# Patient Record
Sex: Male | Born: 1980 | Race: White | Hispanic: No | Marital: Single | Smoking: Never smoker
Health system: Southern US, Community
[De-identification: ages and names within clinical notes are randomized; demographics above are authoritative.]

---

## 2020-06-14 ENCOUNTER — Ambulatory Visit (INDEPENDENT_AMBULATORY_CARE_PROVIDER_SITE_OTHER): Payer: Managed Care, Other (non HMO)

## 2020-06-14 ENCOUNTER — Other Ambulatory Visit: Payer: Self-pay

## 2020-06-14 ENCOUNTER — Ambulatory Visit
Admission: EM | Admit: 2020-06-14 | Discharge: 2020-06-14 | Disposition: A | Discharge status: Home / Self Care | Payer: Managed Care, Other (non HMO) | Attending: Internal Medicine | Admitting: Internal Medicine

## 2020-06-14 DIAGNOSIS — R0602 Shortness of breath: Secondary | ICD-10-CM

## 2020-06-14 DIAGNOSIS — U071 COVID-19: Secondary | ICD-10-CM

## 2020-06-14 MED ORDER — BENZONATATE 100 MG PO CAPS: 100.0000 mg | ORAL_CAPSULE | Freq: Three times a day (TID) | ORAL | 0 refills | Status: AC

## 2020-06-14 MED ORDER — ONDANSETRON 4 MG PO TBDP: 4.0000 mg | ORAL_TABLET | Freq: Three times a day (TID) | ORAL | 0 refills | Status: AC | PRN

## 2020-06-14 MED ORDER — IBUPROFEN 600 MG PO TABS: 600.0000 mg | ORAL_TABLET | Freq: Four times a day (QID) | ORAL | 0 refills | Status: AC | PRN

## 2020-06-14 NOTE — ED Triage Notes (Signed)
Pt began feeling bad on Sunday, tested positive for covid on Monday, having increased sob

## 2020-06-15 LAB — BASIC METABOLIC PANEL
BUN/Creatinine Ratio: 11 (ref 9–20)
BUN: 12 mg/dL (ref 6–24)
CO2: 24 mmol/L (ref 20–29)
Calcium: 8.8 mg/dL (ref 8.7–10.2)
Chloride: 98 mmol/L (ref 96–106)
Creatinine, Ser: 1.11 mg/dL (ref 0.76–1.27)
GFR calc Af Amer: 95 mL/min/{1.73_m2} (ref >59)
GFR calc non Af Amer: 82 mL/min/{1.73_m2} (ref >59)
Glucose: 90 mg/dL (ref 65–99)
Potassium: 4.6 mmol/L (ref 3.5–5.2)
Sodium: 138 mmol/L (ref 134–144)

## 2020-06-15 NOTE — ED Provider Notes (Addendum)
[facility]    CSN: [id number] Arrival date & time: 06/14/20  1143      History   Chief Complaint No chief complaint on file.   HPI Luke Clay is a 39 y.o. male comes to urgent care with cough and increasing shortness of breath of 3 days duration.  Patient was feeling unwell and decided to test himself for Covid at home.  He tested positive for Covid.  He denies any chest tightness.  No fever or chills.  No wheezing.  No vomiting.  No dizziness.  Patient's cough is now productive of sputum.  No diarrhea.  Decreased oral intake with some nausea History reviewed. No pertinent past medical history.  There are no problems to display for this patient.   History reviewed. No pertinent surgical history.     Home Medications    Prior to Admission medications   Medication Sig Start Date End Date Taking? Authorizing Provider  benzonatate (TESSALON) 100 MG capsule Take 1 capsule (100 mg total) by mouth every 8 (eight) hours. 06/14/20   Merrilee Jansky, MD  ibuprofen (ADVIL) 600 MG tablet Take 1 tablet (600 mg total) by mouth every 6 (six) hours as needed. 06/14/20   Merrilee Jansky, MD  ondansetron (ZOFRAN ODT) 4 MG disintegrating tablet Take 1 tablet (4 mg total) by mouth every 8 (eight) hours as needed for nausea or vomiting. 06/14/20   Hadi Dubin, Britta Mccreedy, MD    Family History History reviewed. No pertinent family history.  Social History Social History   Tobacco Use  . Smoking status: Never Smoker  . Smokeless tobacco: Never Used  Substance Use Topics  . Alcohol use: Not Currently  . Drug use: Not Currently     Allergies   Patient has no known allergies.   Review of Systems Review of Systems  Constitutional: Positive for fatigue. Negative for chills and fever.  Respiratory: Positive for cough, chest tightness and shortness of breath. Negative for wheezing.   Cardiovascular: Negative for chest pain.  Gastrointestinal: Negative for diarrhea, nausea  and vomiting.  Genitourinary: Negative.   Neurological: Positive for headaches. Negative for dizziness and numbness.     Physical Exam Triage Vital Signs ED Triage Vitals  Enc Vitals Group     BP 06/14/20 1156 (!) 153/107     Pulse Rate 06/14/20 1156 (!) 103     Resp 06/14/20 1156 (!) 22     Temp 06/14/20 1156 99.1 F (37.3 C)     Temp src --      SpO2 06/14/20 1156 95 %     Weight 06/14/20 1154 238 lb (108 kg)     Height 06/14/20 1154 5\' 9"  (1.753 m)     Head Circumference --      Peak Flow --      Pain Score --      Pain Loc --      Pain Edu? --      Excl. in GC? --    No data found.  Updated Vital Signs BP (!) 153/107   Pulse (!) 103   Temp 99.1 F (37.3 C)   Resp (!) 22   Ht 5\' 9"  (1.753 m)   Wt 108 kg   SpO2 95% Comment: desats to 91  BMI 35.15 kg/m   Visual Acuity Right Eye Distance:   Left Eye Distance:   Bilateral Distance:    Right Eye Near:   Left Eye Near:    Bilateral Near:  Physical Exam Vitals and nursing note reviewed.  Constitutional:      General: He is not in acute distress.    Appearance: He is not ill-appearing.  HENT:     Right Ear: Tympanic membrane normal.     Left Ear: Tympanic membrane normal.  Cardiovascular:     Rate and Rhythm: Normal rate and regular rhythm.     Pulses: Normal pulses.     Heart sounds: Normal heart sounds.  Pulmonary:     Effort: Pulmonary effort is normal.     Breath sounds: Normal breath sounds. No wheezing, rhonchi or rales.  Abdominal:     General: Bowel sounds are normal. There is no distension.     Palpations: Abdomen is soft.     Tenderness: There is no abdominal tenderness.     Hernia: No hernia is present.  Musculoskeletal:     Cervical back: Normal range of motion. No rigidity or tenderness.  Neurological:     Mental Status: He is alert.      UC Treatments / Results  Labs (all labs ordered are listed, but only abnormal results are displayed) Labs Reviewed  BASIC METABOLIC PANEL    Narrative:    Performed at:  [address] [facility] [address], [place], [place]  [id number] Lab Director: Jolene Schimke MD, Phone:  [phone]    EKG   Radiology DG Chest 2 View  Result Date: 06/14/2020 CLINICAL DATA:  COVID-19 positivity with shortness of breath EXAM: CHEST - 2 VIEW COMPARISON:  None. FINDINGS: Cardiac shadow is within normal limits. The lungs are well aerated bilaterally. Bilateral nipple piercings are seen. No acute bony abnormality is seen. IMPRESSION: No acute abnormality noted. Electronically Signed   By: Alcide Clever M.D.   On: 06/14/2020 12:41    Procedures Procedures (including critical care time)  Medications Ordered in UC Medications - No data to display  Initial Impression / Assessment and Plan / UC Course  I have reviewed the triage vital signs and the nursing notes.  Pertinent labs & imaging results that were available during my care of the patient were reviewed by me and considered in my medical decision making (see chart for details).     1.  COVID-19 infection COVID-19 test was positive BMP Tessalon Perles as needed for cough Ibuprofen 600 mg every 6 hours as needed for pain Zofran as needed for nausea vomiting Increase oral fluid intake Please quarantine for 10 days from the onset of symptoms per CDC guidelines Return to urgent care if symptoms worsen. Chest x-ray is negative for acute lung infiltrate. Final Clinical Impressions(s) / UC Diagnoses   Final diagnoses:  COVID-19   Discharge Instructions   None    ED Prescriptions    Medication Sig Dispense Auth. Provider   ibuprofen (ADVIL) 600 MG tablet Take 1 tablet (600 mg total) by mouth every 6 (six) hours as needed. 30 tablet Kemper Hochman, Britta Mccreedy, MD   benzonatate (TESSALON) 100 MG capsule Take 1 capsule (100 mg total) by mouth every 8 (eight) hours. 21 capsule Adrain Butrick, Britta Mccreedy, MD   ondansetron (ZOFRAN ODT) 4 MG disintegrating tablet Take 1 tablet (4 mg total) by mouth  every 8 (eight) hours as needed for nausea or vomiting. 20 tablet Kaladin Noseworthy, Britta Mccreedy, MD     PDMP not reviewed this encounter.   Merrilee Jansky, MD 06/19/20 1132    Merrilee Jansky, MD 06/19/20 [phone]

## 2020-06-16 ENCOUNTER — Telehealth: Payer: Self-pay | Admitting: Physician Assistant

## 2020-06-16 NOTE — Telephone Encounter (Signed)
Called to Discuss with patient about Covid symptoms and the use of the monoclonal antibody infusion for those with mild to moderate Covid symptoms and at a high risk of hospitalization.     Pt appears to qualify for this infusion due to co-morbid conditions and/or a member of an at-risk group in accordance with the FDA Emergency Use Authorization.    Unable to reach pt - VM full, sent [facility] message.   Appears to qualify with BMI only. Sx 12/10, + 12/13. ER visit 12/16, currently at Urgent Care  today.

## 2021-05-15 IMAGING — DX DG CHEST 2V
2 series · 2 of 2 positions shown · non-contrast
Comparison: None.

CLINICAL DATA: WHN2D-RL positivity with shortness of breath

EXAM:
CHEST - 2 VIEW

[chest pa]
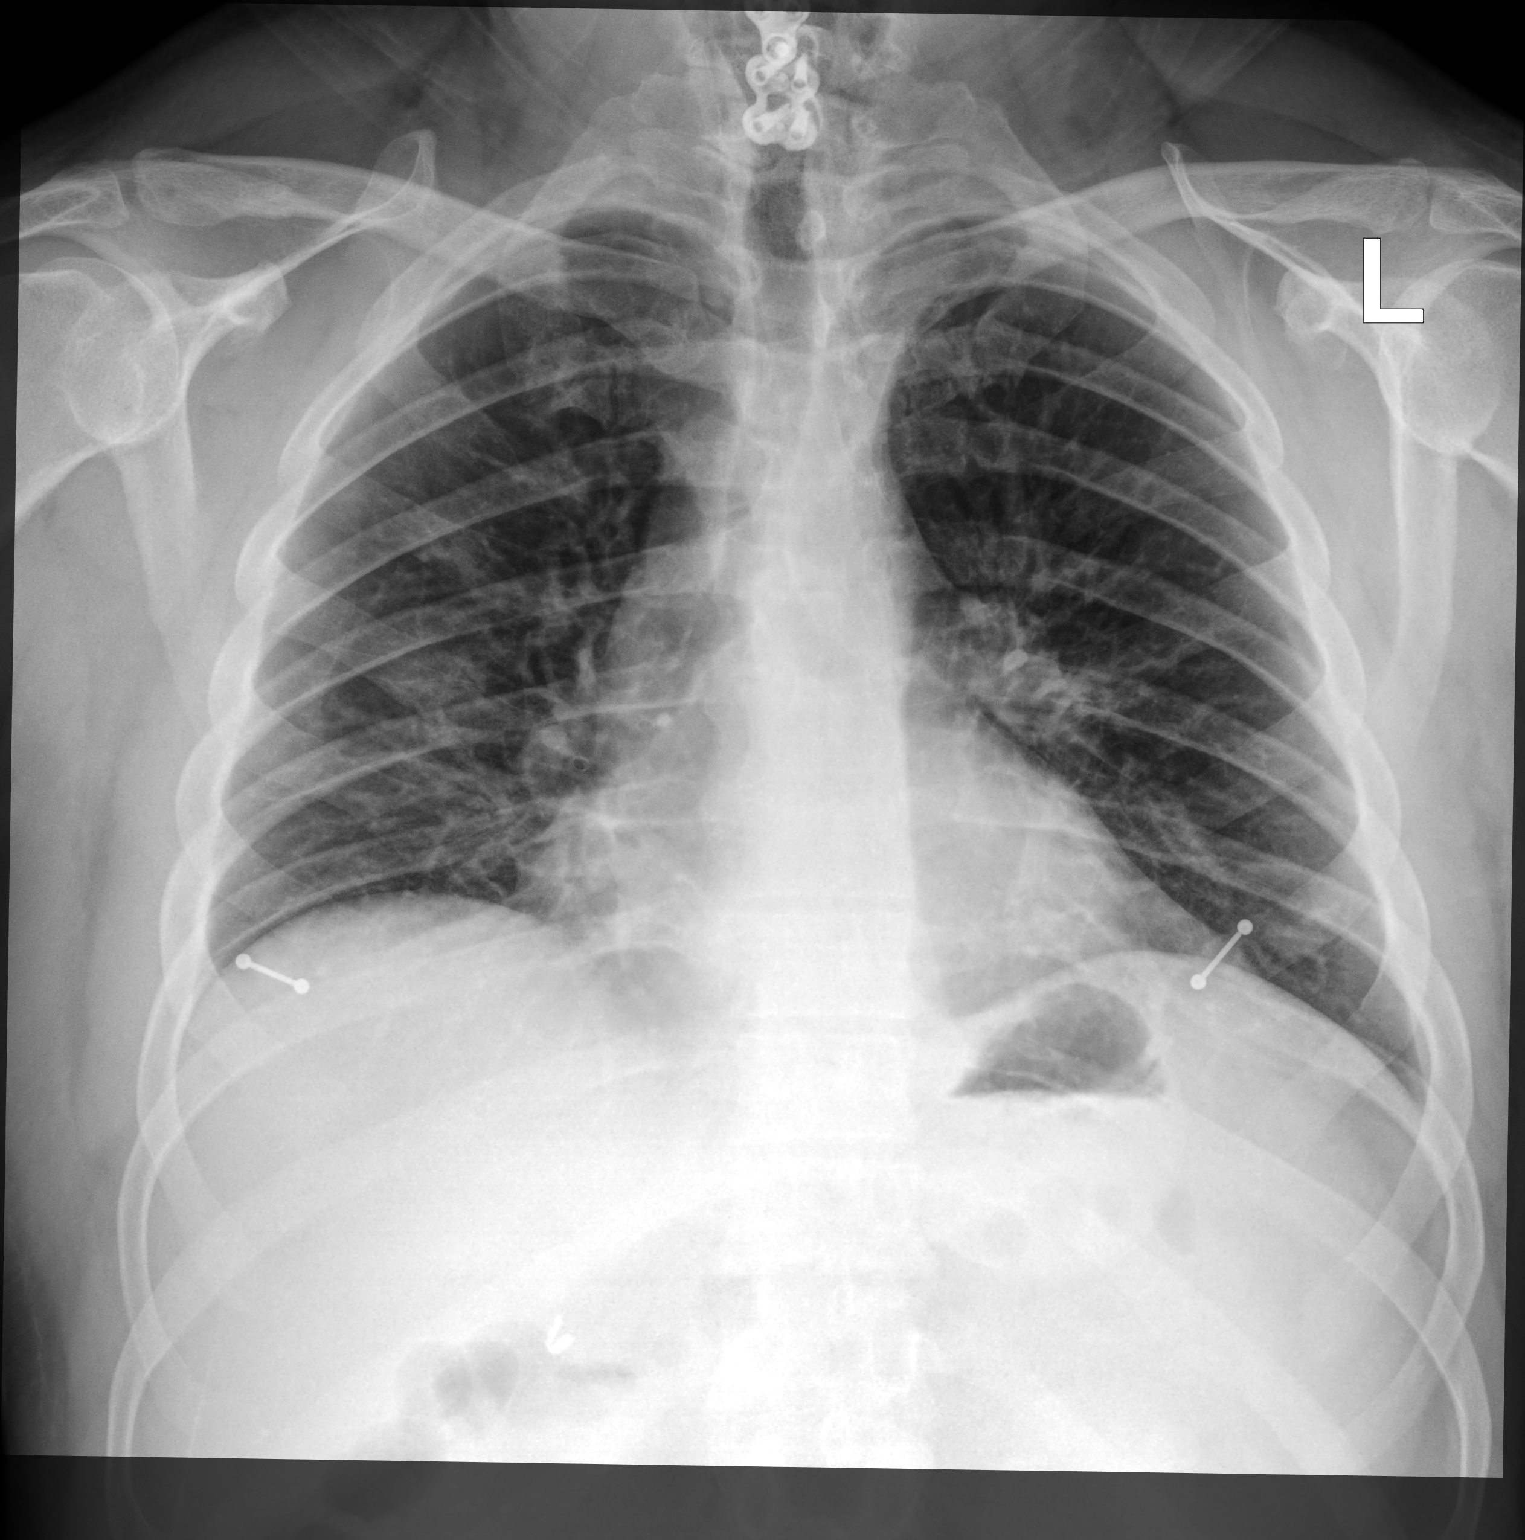

[chest lat]
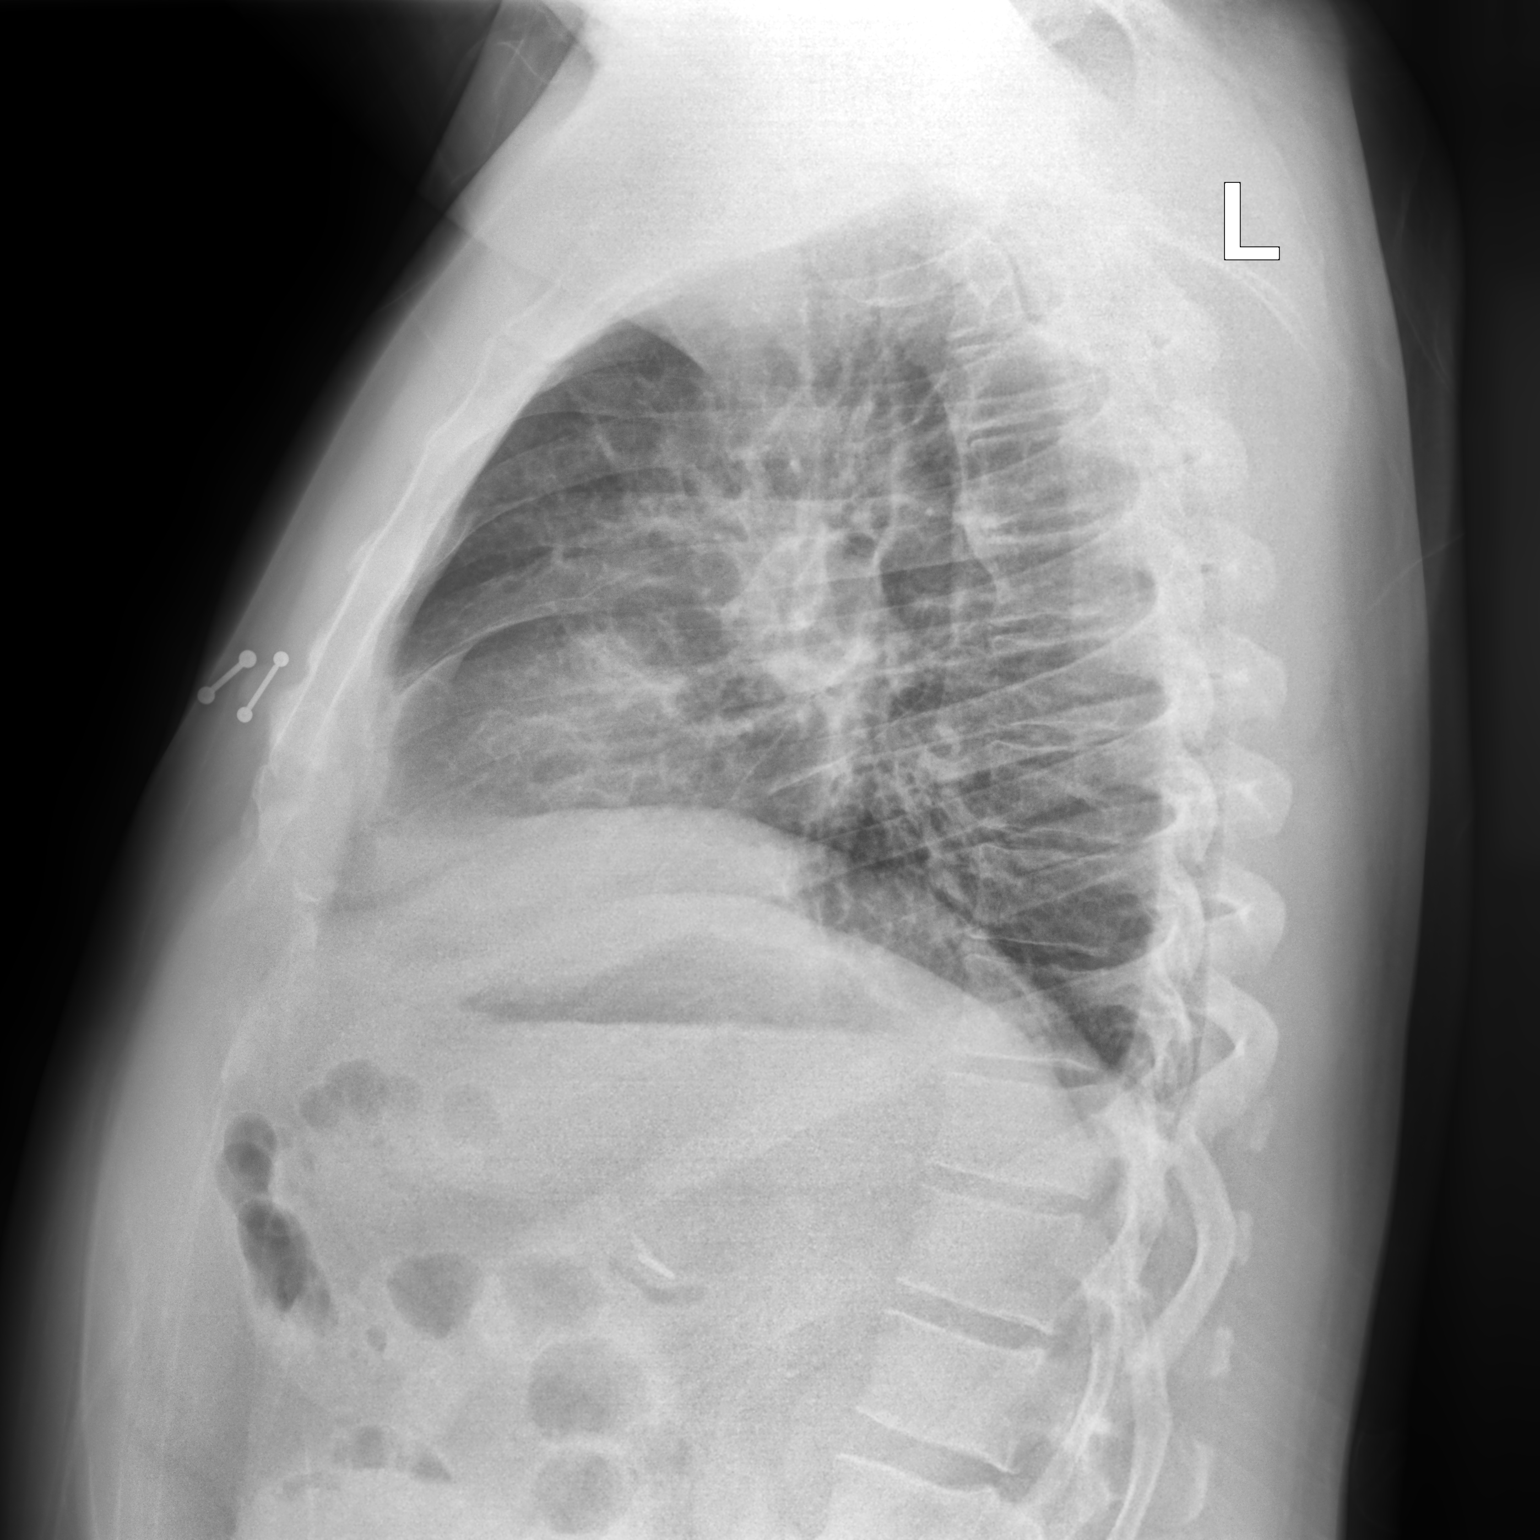

[2 of 2 positions shown; findings below may reference images not displayed]

FINDINGS: Cardiac shadow is within normal limits. The lungs are well aerated
bilaterally. Bilateral nipple piercings are seen. No acute bony
abnormality is seen.
IMPRESSION: No acute abnormality noted.
